# Patient Record
Sex: Female | Born: 1970 | Race: White | Hispanic: No | Marital: Single
Health system: Southern US, Community
[De-identification: ages and names within clinical notes are randomized; demographics above are authoritative.]

---

## 2004-10-19 ENCOUNTER — Emergency Department: Payer: Self-pay | Admitting: Internal Medicine

## 2004-10-22 ENCOUNTER — Emergency Department: Payer: Self-pay | Admitting: Emergency Medicine

## 2004-10-22 ENCOUNTER — Emergency Department: Payer: Self-pay | Admitting: Unknown Physician Specialty

## 2006-04-21 ENCOUNTER — Emergency Department: Payer: Self-pay | Admitting: General Practice

## 2006-05-09 ENCOUNTER — Emergency Department: Payer: Self-pay | Admitting: Emergency Medicine

## 2006-07-19 ENCOUNTER — Emergency Department: Payer: Self-pay

## 2007-05-31 ENCOUNTER — Emergency Department: Payer: Self-pay | Admitting: Emergency Medicine

## 2007-05-31 ENCOUNTER — Other Ambulatory Visit: Payer: Self-pay

## 2007-09-21 ENCOUNTER — Emergency Department: Payer: Self-pay | Admitting: Emergency Medicine

## 2007-09-21 ENCOUNTER — Other Ambulatory Visit: Payer: Self-pay

## 2007-09-29 ENCOUNTER — Emergency Department: Payer: Self-pay | Admitting: Emergency Medicine

## 2008-05-22 ENCOUNTER — Emergency Department: Payer: Self-pay | Admitting: Emergency Medicine

## 2008-05-27 ENCOUNTER — Inpatient Hospital Stay: Payer: Self-pay | Admitting: Internal Medicine

## 2008-05-27 ENCOUNTER — Other Ambulatory Visit: Payer: Self-pay

## 2008-06-17 ENCOUNTER — Other Ambulatory Visit: Payer: Self-pay

## 2008-06-17 ENCOUNTER — Emergency Department: Payer: Self-pay | Admitting: Emergency Medicine

## 2008-10-13 ENCOUNTER — Inpatient Hospital Stay: Payer: Self-pay | Admitting: Internal Medicine

## 2009-01-02 ENCOUNTER — Emergency Department: Payer: Self-pay | Admitting: Emergency Medicine

## 2009-01-13 ENCOUNTER — Emergency Department: Payer: Self-pay | Admitting: Emergency Medicine

## 2009-02-09 ENCOUNTER — Ambulatory Visit: Payer: Self-pay | Admitting: Pain Medicine

## 2009-06-26 ENCOUNTER — Emergency Department: Payer: Self-pay | Admitting: Emergency Medicine

## 2009-07-24 ENCOUNTER — Inpatient Hospital Stay: Payer: Self-pay | Admitting: Surgery

## 2009-08-29 ENCOUNTER — Emergency Department: Payer: Self-pay | Admitting: Emergency Medicine

## 2009-09-24 ENCOUNTER — Emergency Department: Payer: Self-pay | Admitting: Emergency Medicine

## 2009-11-12 ENCOUNTER — Emergency Department: Payer: Self-pay | Admitting: Emergency Medicine

## 2009-12-24 ENCOUNTER — Emergency Department: Payer: Self-pay | Admitting: Internal Medicine

## 2010-03-03 ENCOUNTER — Emergency Department: Payer: Self-pay | Admitting: Emergency Medicine

## 2010-06-25 ENCOUNTER — Emergency Department: Payer: Self-pay | Admitting: Emergency Medicine

## 2010-10-19 ENCOUNTER — Emergency Department: Payer: Self-pay | Admitting: Emergency Medicine

## 2010-11-17 ENCOUNTER — Emergency Department: Payer: Self-pay | Admitting: Internal Medicine

## 2011-07-27 ENCOUNTER — Emergency Department: Payer: Self-pay | Admitting: Emergency Medicine

## 2011-07-30 ENCOUNTER — Emergency Department: Payer: Self-pay | Admitting: Emergency Medicine

## 2011-09-17 ENCOUNTER — Emergency Department: Payer: Self-pay

## 2011-09-28 ENCOUNTER — Emergency Department: Payer: Self-pay | Admitting: Internal Medicine

## 2011-10-02 ENCOUNTER — Other Ambulatory Visit: Payer: Self-pay | Admitting: Nurse Practitioner

## 2011-10-02 ENCOUNTER — Encounter: Payer: Self-pay | Admitting: Cardiothoracic Surgery

## 2011-10-02 ENCOUNTER — Encounter: Payer: Self-pay | Admitting: Nurse Practitioner

## 2011-12-04 ENCOUNTER — Encounter: Payer: Self-pay | Admitting: Cardiothoracic Surgery

## 2011-12-27 ENCOUNTER — Emergency Department: Payer: Self-pay | Admitting: Emergency Medicine

## 2012-01-19 ENCOUNTER — Emergency Department: Payer: Self-pay | Admitting: Internal Medicine

## 2012-01-29 ENCOUNTER — Emergency Department: Payer: Self-pay | Admitting: *Deleted

## 2012-03-15 ENCOUNTER — Emergency Department: Payer: Self-pay | Admitting: *Deleted

## 2012-09-19 ENCOUNTER — Emergency Department: Payer: Self-pay | Admitting: Internal Medicine

## 2012-09-19 LAB — URINALYSIS, COMPLETE
Bilirubin,UR: NEGATIVE
Glucose,UR: 50 mg/dL (ref 0–75)
Leukocyte Esterase: NEGATIVE
Nitrite: NEGATIVE
Protein: >=500
RBC,UR: 19 /HPF (ref 0–5)
Specific Gravity: 1.023 (ref 1.003–1.030)
Squamous Epithelial: 18
WBC UR: 20 /HPF (ref 0–5)

## 2012-09-19 LAB — COMPREHENSIVE METABOLIC PANEL
Albumin: 4.5 g/dL (ref 3.4–5.0)
BUN: 13 mg/dL (ref 7–18)
Calcium, Total: 10.2 mg/dL — ABNORMAL HIGH (ref 8.5–10.1)
EGFR (African American): >60
Glucose: 138 mg/dL — ABNORMAL HIGH (ref 65–99)
Osmolality: 269 (ref 275–301)
SGOT(AST): 21 U/L (ref 15–37)
SGPT (ALT): 21 U/L (ref 12–78)
Total Protein: 10.4 g/dL — ABNORMAL HIGH (ref 6.4–8.2)

## 2012-09-19 LAB — CBC
HCT: 48.1 % — ABNORMAL HIGH (ref 35.0–47.0)
HGB: 16.4 g/dL — ABNORMAL HIGH (ref 12.0–16.0)
MCHC: 34 g/dL (ref 32.0–36.0)
Platelet: 292 10*3/uL (ref 150–440)
RBC: 5.14 10*6/uL (ref 3.80–5.20)
WBC: 16.2 10*3/uL — ABNORMAL HIGH (ref 3.6–11.0)

## 2012-09-19 LAB — DRUG SCREEN, URINE
Barbiturates, Ur Screen: NEGATIVE (ref ?–200)
Benzodiazepine, Ur Scrn: NEGATIVE (ref ?–200)
Cannabinoid 50 Ng, Ur ~~LOC~~: POSITIVE (ref ?–50)
MDMA (Ecstasy)Ur Screen: NEGATIVE (ref ?–500)
Methadone, Ur Screen: NEGATIVE (ref ?–300)
Opiate, Ur Screen: NEGATIVE (ref ?–300)
Phencyclidine (PCP) Ur S: NEGATIVE (ref ?–25)

## 2012-09-25 LAB — CULTURE, BLOOD (SINGLE)

## 2012-11-30 IMAGING — CT CT CERVICAL SPINE WITHOUT CONTRAST
1 series · 12 of 14 positions shown, 15 images · non-contrast
Comparison: none

REASON FOR EXAM: fall from [place] with pain C5-6 to T1 area,  hit head
also   Flex 10
COMMENTS:   LMP: Now

PROCEDURE:     CT  - CT CERVICAL SPINE WO  - January 29, 2012  [DATE]
RESULT:     CT cervical spine
TECHNIQUE: Helical 2 mm sections were obtained. Reconstructions were
performed utilizing a bone algorithm in coronal, sagittal, and axial planes.

[Series 6: axial · axial · 0.31mm/px · z∈[-254,-111]mm · 12 of 91 slices shown, 15 images]
[im 7/91  soft-tissue]
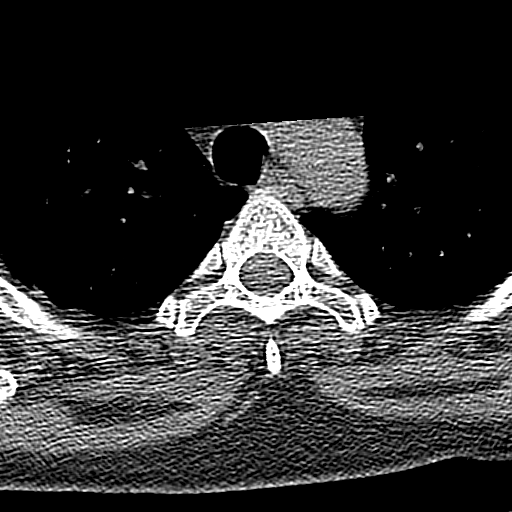
[im 7/91  bone]
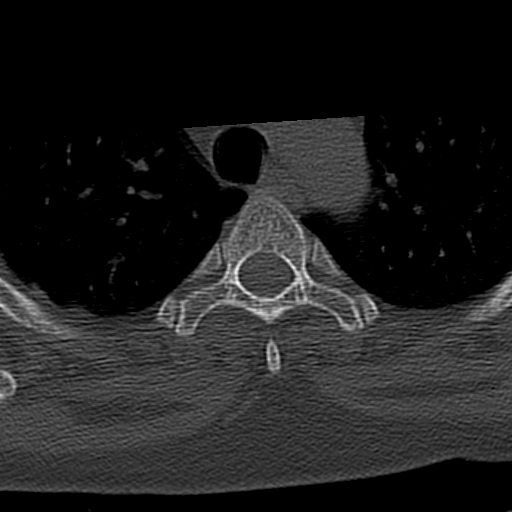
[im 14/91  bone]
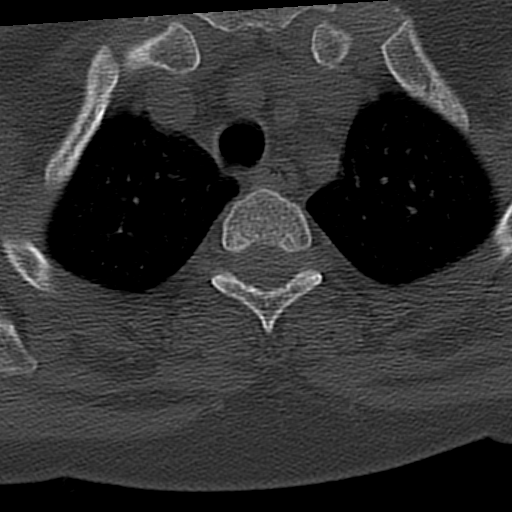
[im 21/91  bone]
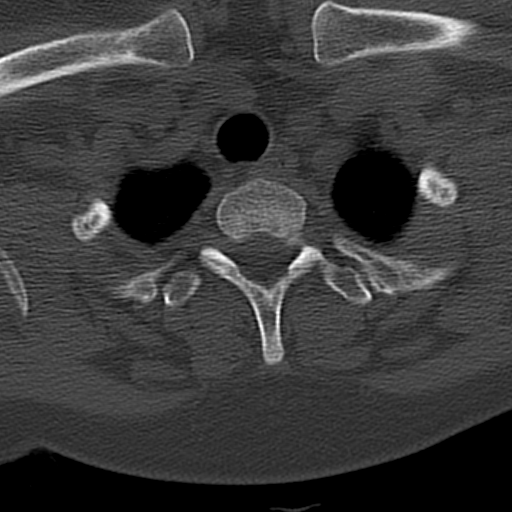
[im 28/91  bone]
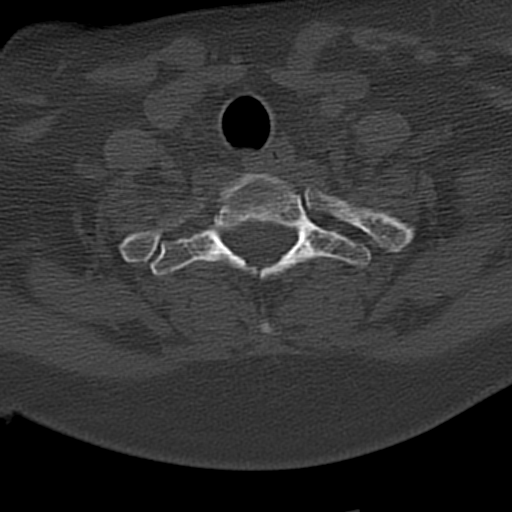
[im 35/91  soft-tissue]
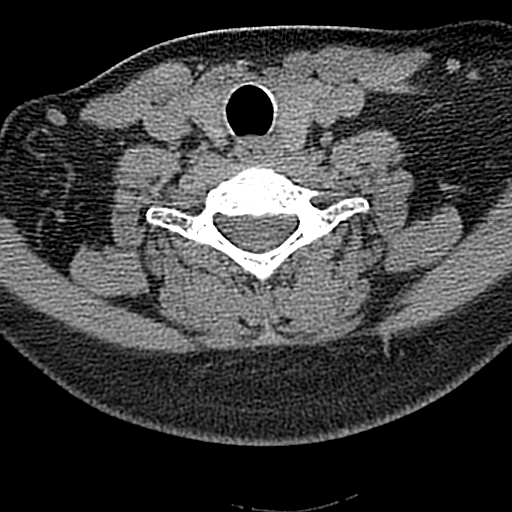
[im 35/91  bone]
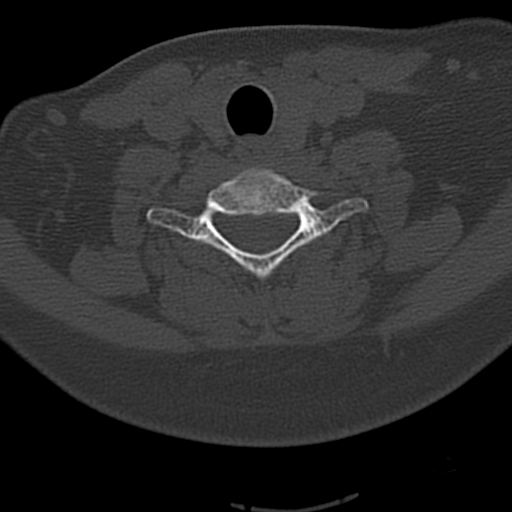
[im 42/91  bone]
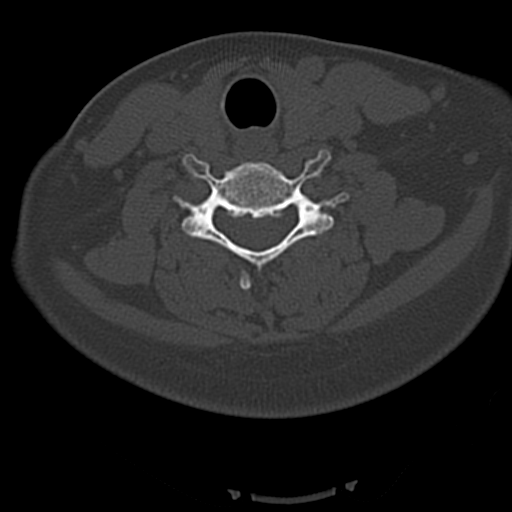
[im 49/91  bone]
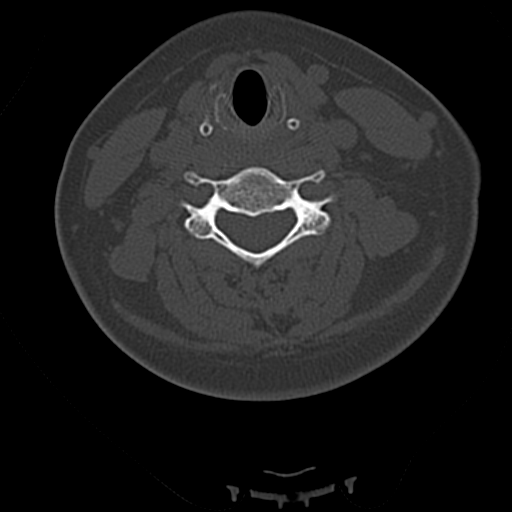
[im 56/91  bone]
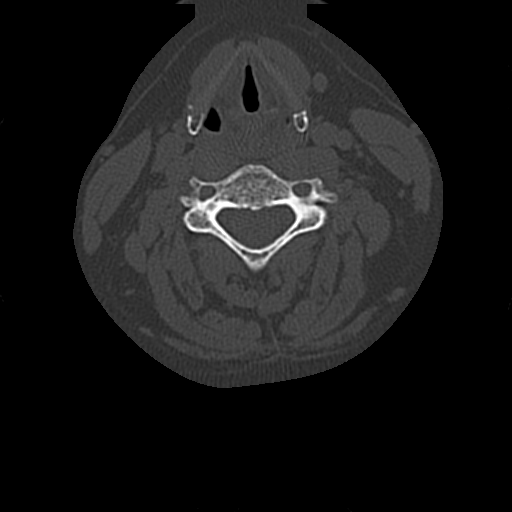
[im 63/91  soft-tissue]
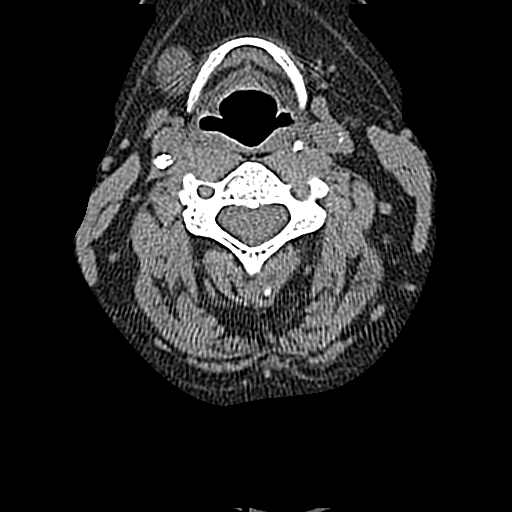
[im 63/91  bone]
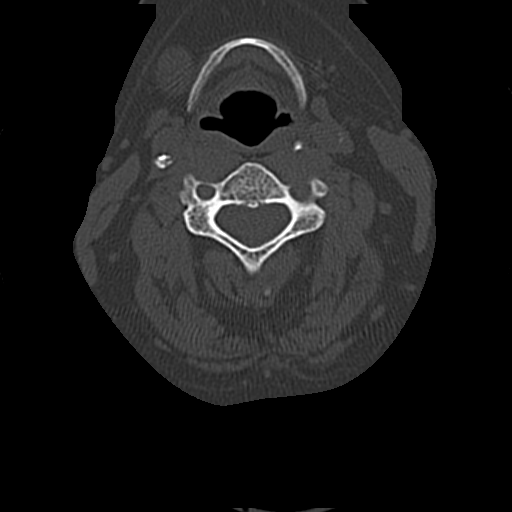
[im 70/91  bone]
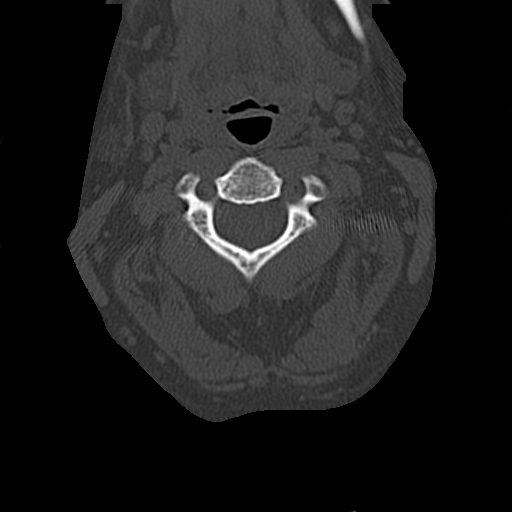
[im 77/91  bone]
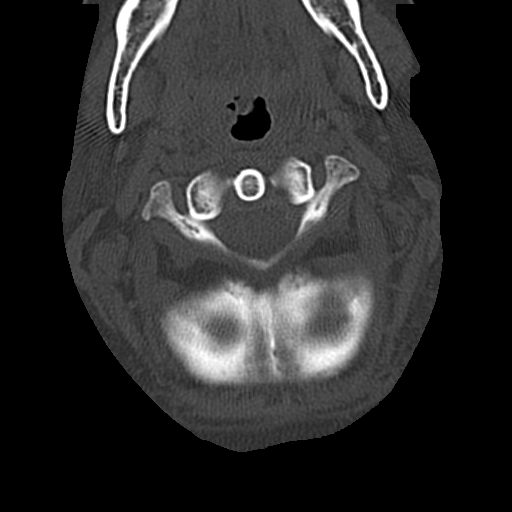
[im 84/91  bone]
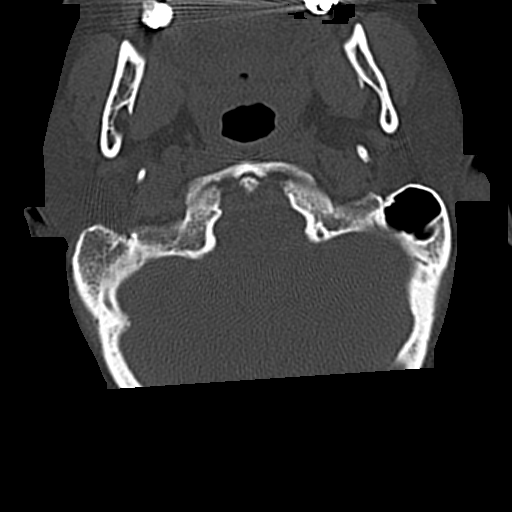

[12 of 14 positions shown; findings below may reference images not displayed]

FINDINGS: There is no evidence of acute fracture, dislocation, or
malalignment. There is no evidence of prevertebral soft tissue swelling nor
evidence of canal stenosis.
IMPRESSION: No CT evidence of acute osseous abnormalities.

## 2012-11-30 IMAGING — CT CT HEAD WITHOUT CONTRAST
1 series · 16 of 30 positions shown, 20 images · non-contrast
Comparison: none

REASON FOR EXAM: fall from [place]   [place] 10  h/a, dizziness and right
sided head pain
COMMENTS:   LMP: Now

PROCEDURE:     CT  - CT HEAD WITHOUT CONTRAST  - January 29, 2012  [DATE]
RESULT:     Technique: Helical 5mm sections were obtained from the skull
base to the vertex without administration of intravenous contrast.

[Series 2: soft tissue · axial · 0.45mm/px · z∈[-147,-12]mm · 16 of 31 slices shown, 20 images]
[im 2/31  brain]
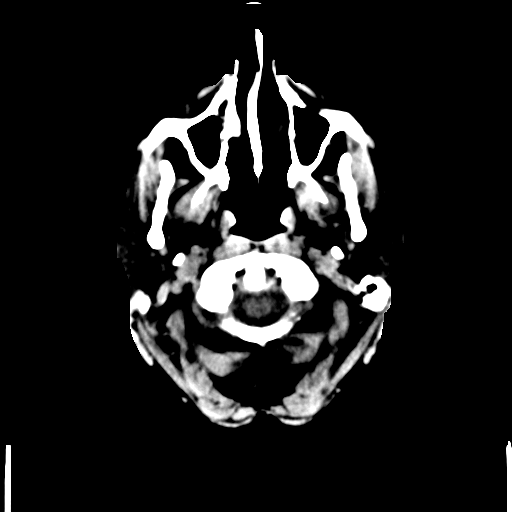
[im 2/31  bone]
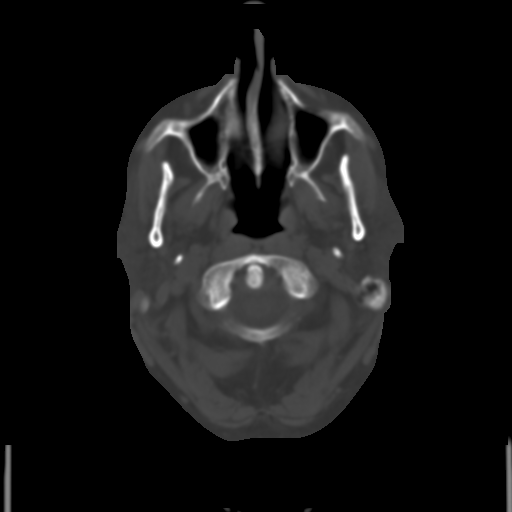
[im 4/31  brain]
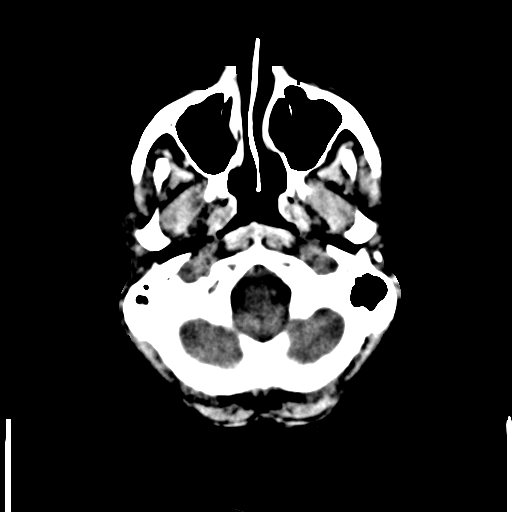
[im 6/31  brain]
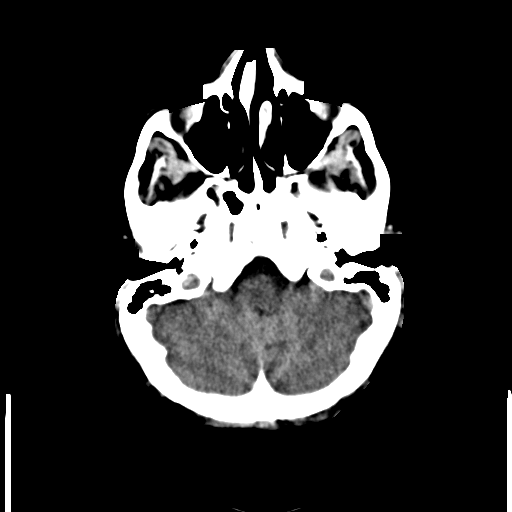
[im 8/31  brain]
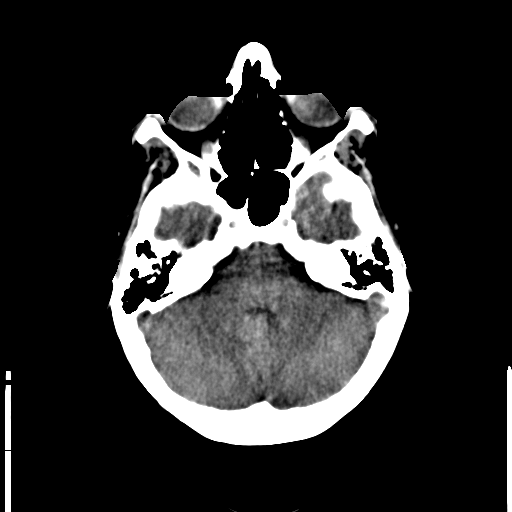
[im 9/31  brain]
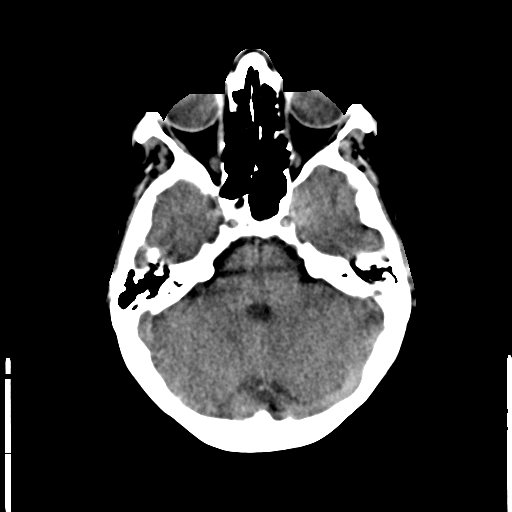
[im 9/31  bone]
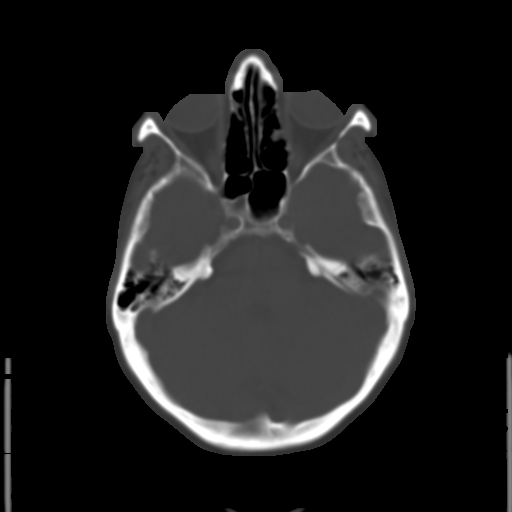
[im 11/31  brain]
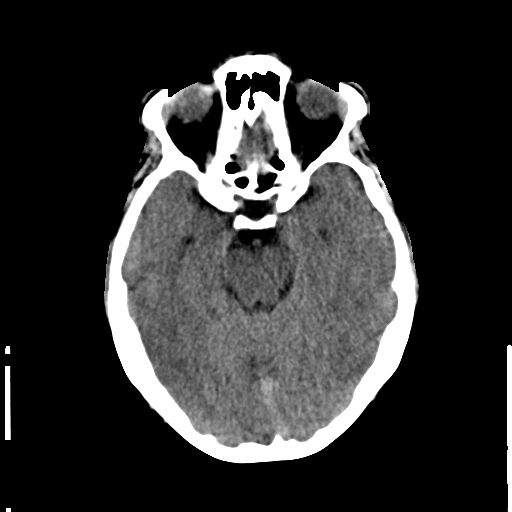
[im 13/31  brain]
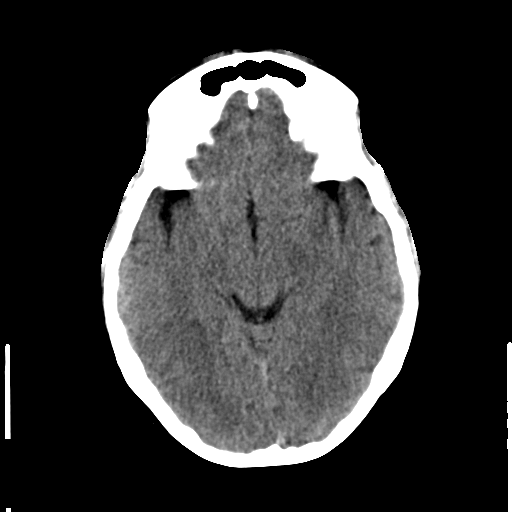
[im 15/31  brain]
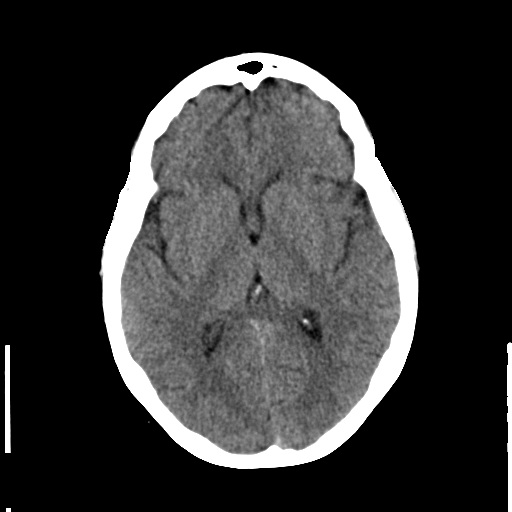
[im 16/31  brain]
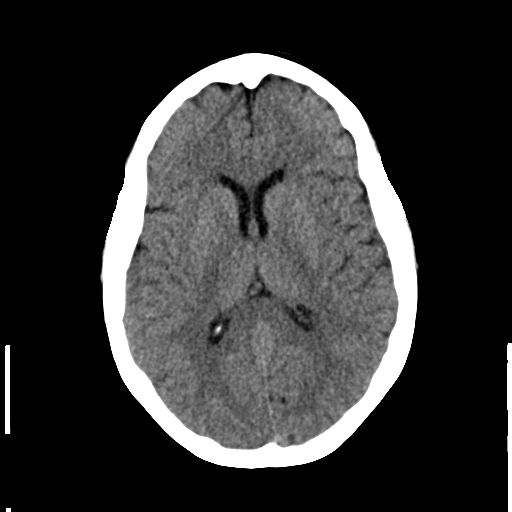
[im 16/31  bone]
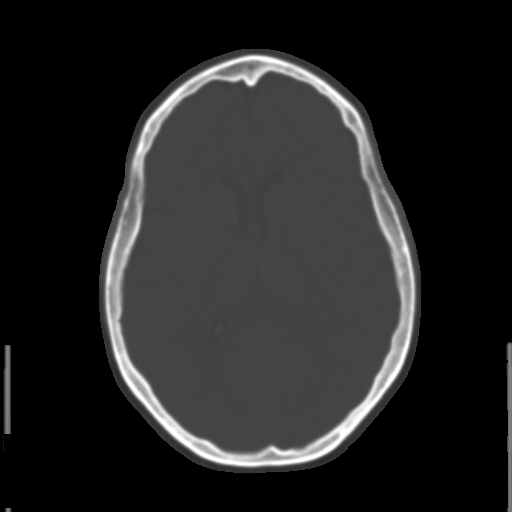
[im 18/31  brain]
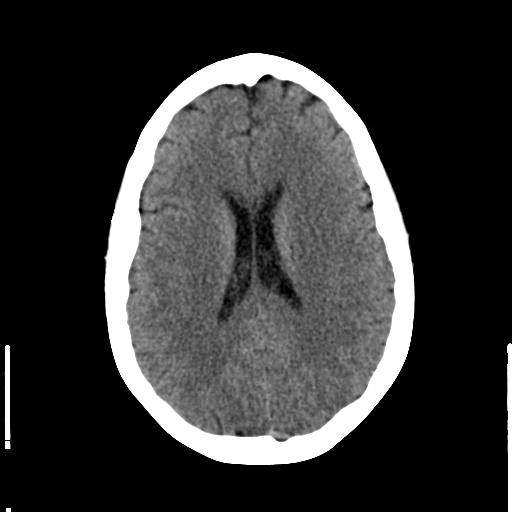
[im 20/31  brain]
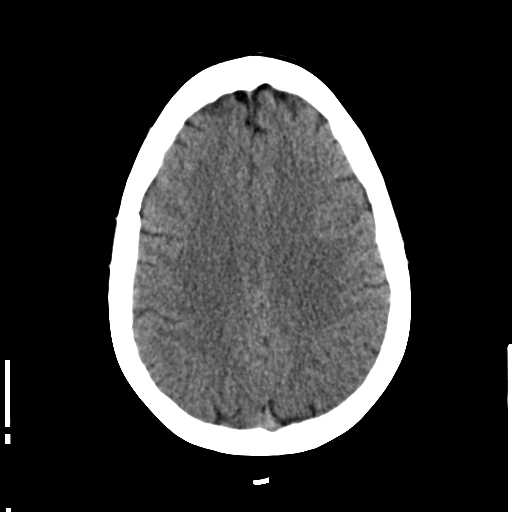
[im 22/31  brain]
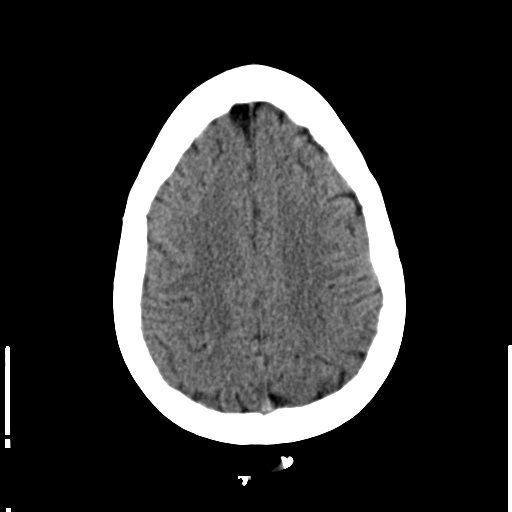
[im 23/31  brain]
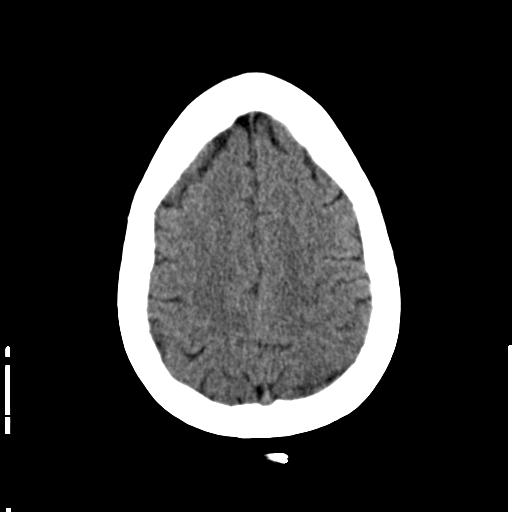
[im 23/31  bone]
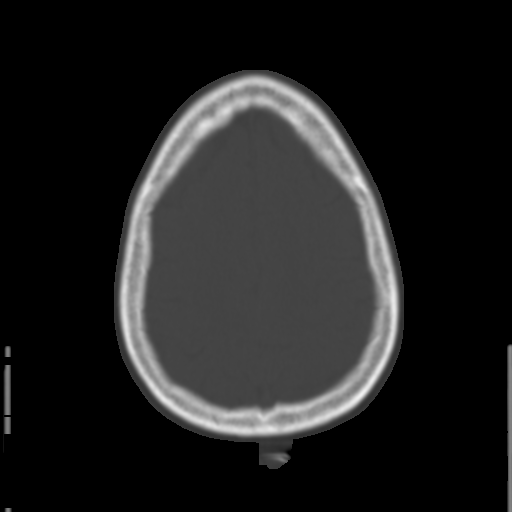
[im 25/31  brain]
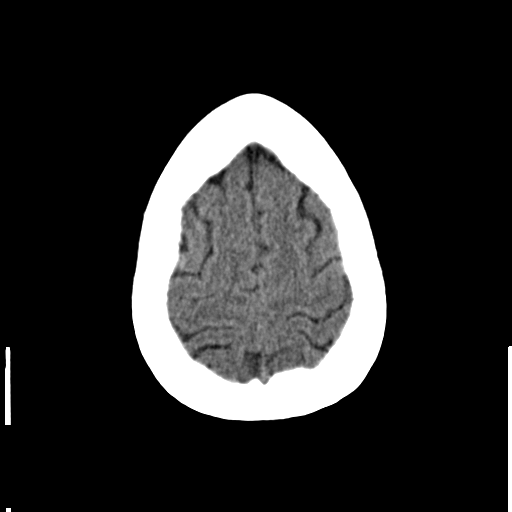
[im 27/31  brain]
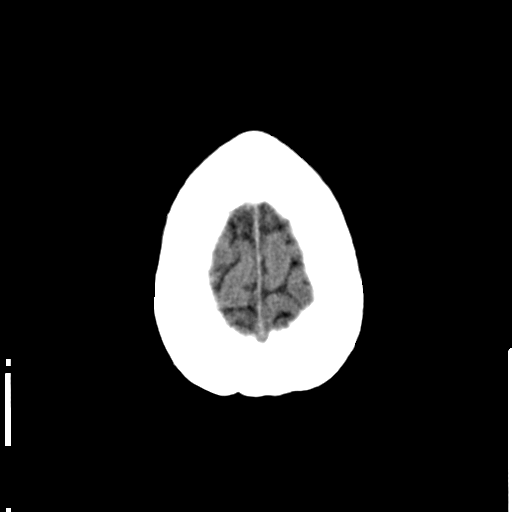
[im 29/31  brain]
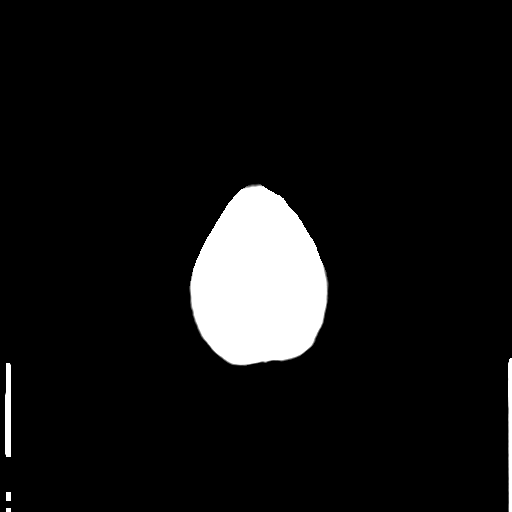

[16 of 30 positions shown; findings below may reference images not displayed]

FINDINGS: There is not evidence of intra-axial fluid collections. There is
no evidence of acute hemorrhage or secondary signs reflecting mass effect or
subacute or chronic focal territorial infarction. The osseous structures
demonstrate no evidence of a depressed skull fracture. If there is
persistent concern clinical follow-up with MRI is recommended.
IMPRESSION: 1. No evidence of acute intracranial abnormalitites.
2. A comparison is made to prior study dated 11/17/2010.

## 2013-04-01 LAB — COMPREHENSIVE METABOLIC PANEL
Anion Gap: 10 (ref 7–16)
BUN: 32 mg/dL — ABNORMAL HIGH (ref 7–18)
Co2: 25 mmol/L (ref 21–32)
Creatinine: 1 mg/dL (ref 0.60–1.30)
EGFR (Non-African Amer.): >60
Glucose: 119 mg/dL — ABNORMAL HIGH (ref 65–99)
Osmolality: 273 (ref 275–301)
Potassium: 3.2 mmol/L — ABNORMAL LOW (ref 3.5–5.1)
SGPT (ALT): 27 U/L (ref 12–78)

## 2013-04-01 LAB — URINALYSIS, COMPLETE
Bilirubin,UR: NEGATIVE
Glucose,UR: NEGATIVE mg/dL (ref 0–75)
Hyaline Cast: 1
Leukocyte Esterase: NEGATIVE
Ph: 5 (ref 4.5–8.0)
RBC,UR: 13 /HPF (ref 0–5)
Specific Gravity: 1.033 (ref 1.003–1.030)
Squamous Epithelial: 1
WBC UR: 11 /HPF (ref 0–5)

## 2013-04-01 LAB — CBC
HCT: 48.9 % — ABNORMAL HIGH (ref 35.0–47.0)
HGB: 16.6 g/dL — ABNORMAL HIGH (ref 12.0–16.0)
MCH: 31.1 pg (ref 26.0–34.0)
MCHC: 34 g/dL (ref 32.0–36.0)
Platelet: 296 10*3/uL (ref 150–440)
WBC: 17.2 10*3/uL — ABNORMAL HIGH (ref 3.6–11.0)

## 2013-04-01 LAB — PREGNANCY, URINE: Pregnancy Test, Urine: NEGATIVE m[IU]/mL

## 2013-04-02 ENCOUNTER — Inpatient Hospital Stay: Payer: Self-pay | Admitting: Internal Medicine

## 2013-04-02 LAB — CLOSTRIDIUM DIFFICILE BY PCR

## 2013-04-03 LAB — BASIC METABOLIC PANEL
BUN: 12 mg/dL (ref 7–18)
Chloride: 103 mmol/L (ref 98–107)
Co2: 28 mmol/L (ref 21–32)
EGFR (African American): >60
Glucose: 85 mg/dL (ref 65–99)
Potassium: 2.9 mmol/L — ABNORMAL LOW (ref 3.5–5.1)
Sodium: 138 mmol/L (ref 136–145)

## 2013-04-03 LAB — CBC WITH DIFFERENTIAL/PLATELET
Basophil %: 0.6 %
Eosinophil #: 0.3 10*3/uL (ref 0.0–0.7)
HCT: 36.6 % (ref 35.0–47.0)
HGB: 12.5 g/dL (ref 12.0–16.0)
Lymphocyte %: 20.8 %
MCV: 92 fL (ref 80–100)
Monocyte %: 11.9 %
Neutrophil %: 64.2 %
RBC: 4 10*6/uL (ref 3.80–5.20)
WBC: 11.8 10*3/uL — ABNORMAL HIGH (ref 3.6–11.0)

## 2013-04-03 LAB — URINE CULTURE

## 2013-04-05 LAB — CBC WITH DIFFERENTIAL/PLATELET
Basophil %: 0.5 %
Eosinophil #: 0.5 10*3/uL (ref 0.0–0.7)
HGB: 12.3 g/dL (ref 12.0–16.0)
Lymphocyte #: 2.1 10*3/uL (ref 1.0–3.6)
Lymphocyte %: 15.7 %
MCV: 91 fL (ref 80–100)
Monocyte %: 10.9 %
Neutrophil #: 9.4 10*3/uL — ABNORMAL HIGH (ref 1.4–6.5)
Platelet: 207 10*3/uL (ref 150–440)
RBC: 3.95 10*6/uL (ref 3.80–5.20)
RDW: 12.1 % (ref 11.5–14.5)
WBC: 13.6 10*3/uL — ABNORMAL HIGH (ref 3.6–11.0)

## 2013-04-05 LAB — BASIC METABOLIC PANEL
BUN: 6 mg/dL — ABNORMAL LOW (ref 7–18)
Calcium, Total: 8.3 mg/dL — ABNORMAL LOW (ref 8.5–10.1)
Creatinine: 0.64 mg/dL (ref 0.60–1.30)
EGFR (African American): >60
Osmolality: 277 (ref 275–301)
Sodium: 139 mmol/L (ref 136–145)

## 2013-04-05 LAB — LIPID PANEL
Cholesterol: 104 mg/dL (ref 0–200)
HDL Cholesterol: 25 mg/dL — ABNORMAL LOW (ref 40–60)
Ldl Cholesterol, Calc: 55 mg/dL (ref 0–100)

## 2013-04-05 LAB — MAGNESIUM: Magnesium: 1.2 mg/dL — ABNORMAL LOW

## 2013-04-06 LAB — PATHOLOGY REPORT

## 2013-04-07 LAB — CULTURE, BLOOD (SINGLE)

## 2013-07-05 ENCOUNTER — Emergency Department: Payer: Self-pay | Admitting: Internal Medicine

## 2014-06-17 ENCOUNTER — Emergency Department: Payer: Self-pay | Admitting: Emergency Medicine

## 2014-06-17 LAB — COMPREHENSIVE METABOLIC PANEL
ANION GAP: 11 (ref 7–16)
AST: 23 U/L (ref 15–37)
Albumin: 4.4 g/dL (ref 3.4–5.0)
Alkaline Phosphatase: 85 U/L
BUN: 18 mg/dL (ref 7–18)
Bilirubin,Total: 0.5 mg/dL (ref 0.2–1.0)
CHLORIDE: 96 mmol/L — AB (ref 98–107)
CREATININE: 1.07 mg/dL (ref 0.60–1.30)
Calcium, Total: 9.7 mg/dL (ref 8.5–10.1)
Co2: 28 mmol/L (ref 21–32)
EGFR (Non-African Amer.): >60
GLUCOSE: 151 mg/dL — AB (ref 65–99)
OSMOLALITY: 275 (ref 275–301)
Potassium: 3.6 mmol/L (ref 3.5–5.1)
SGPT (ALT): 26 U/L
Sodium: 135 mmol/L — ABNORMAL LOW (ref 136–145)
Total Protein: 9.3 g/dL — ABNORMAL HIGH (ref 6.4–8.2)

## 2014-06-17 LAB — CBC
HCT: 48.1 % — AB (ref 35.0–47.0)
HGB: 15.9 g/dL (ref 12.0–16.0)
MCH: 29.8 pg (ref 26.0–34.0)
MCHC: 33 g/dL (ref 32.0–36.0)
MCV: 90 fL (ref 80–100)
PLATELETS: 304 10*3/uL (ref 150–440)
RBC: 5.34 10*6/uL — AB (ref 3.80–5.20)
RDW: 12.5 % (ref 11.5–14.5)
WBC: 12.7 10*3/uL — ABNORMAL HIGH (ref 3.6–11.0)

## 2014-06-17 LAB — URINALYSIS, COMPLETE
BILIRUBIN, UR: NEGATIVE
Glucose,UR: NEGATIVE mg/dL (ref 0–75)
Ketone: NEGATIVE
Leukocyte Esterase: NEGATIVE
Nitrite: NEGATIVE
PH: 7 (ref 4.5–8.0)
Protein: 100
RBC,UR: 9 /HPF (ref 0–5)
Specific Gravity: 1.019 (ref 1.003–1.030)
Squamous Epithelial: 8
WBC UR: 3 /HPF (ref 0–5)

## 2014-06-17 LAB — LIPASE, BLOOD: Lipase: 92 U/L (ref 73–393)

## 2014-06-17 LAB — PREGNANCY, URINE: PREGNANCY TEST, URINE: NEGATIVE m[IU]/mL

## 2014-10-20 ENCOUNTER — Emergency Department: Payer: Self-pay | Admitting: Emergency Medicine

## 2014-10-20 LAB — URINALYSIS, COMPLETE
Bilirubin,UR: NEGATIVE
Glucose,UR: NEGATIVE mg/dL (ref 0–75)
Ketone: NEGATIVE
Nitrite: POSITIVE
PH: 7 (ref 4.5–8.0)
Protein: 100
Specific Gravity: 1.018 (ref 1.003–1.030)
WBC UR: 530 /HPF (ref 0–5)

## 2014-10-22 LAB — URINE CULTURE

## 2015-01-19 ENCOUNTER — Emergency Department: Payer: Self-pay | Admitting: Emergency Medicine

## 2015-01-19 LAB — URINALYSIS, COMPLETE
Bacteria: NONE SEEN
Bilirubin,UR: NEGATIVE
Blood: NEGATIVE
Glucose,UR: NEGATIVE mg/dL
Ketone: NEGATIVE
Leukocyte Esterase: NEGATIVE
Nitrite: NEGATIVE
Ph: 6
Protein: 30
RBC,UR: 18 /HPF
Specific Gravity: 1.02
Squamous Epithelial: 15
WBC UR: 10 /HPF

## 2015-01-21 LAB — URINE CULTURE

## 2015-02-18 NOTE — Consult Note (Signed)
PATIENT NAME:  Melanie Novak, Melanie Novak MR#:  [id number] DATE OF BIRTH:  10-11-1971  DATE OF CONSULTATION:  04/02/2013  REFERRING PHYSICIAN:   CONSULTING PHYSICIAN:  Midge Miniumarren Robertha Staples, MD  CONSULTING SERVICE:  Gastroenterology.  CHIEF COMPLAINT:  Rectal bleeding, nausea, vomiting, abdominal pain.   HISTORY OF PRESENT ILLNESS:  This patient is a 44 year old woman who has a history of recurrent pancreatitis and a pseudocyst with her being seen in the past by Dr. Marva PandaSkulskie and [facility].  The patient now reports that she has had approximately five days of left upper quadrant pain which is sharp.  She also states that she has her pain typical of her acute pancreatitis.  The patient also reports that she had some bright red blood per rectum.  The patient had a CT scan of her abdomen that did not show any acute abnormalities and did not show any masses or cysts in her pancreas as had been seen prior.  She also reports that she has been having diarrhea in addition to the bright red blood per rectum.  The patient's last admission to this hospital for her pancreatic pseudocyst and abdominal pain with pancreatitis was back in September of 2010.  She denies vomiting any blood.  The patient has been recently in jail for failure to appear in court on a speeding ticket.   PAST MEDICAL HISTORY:  Recurrent pancreatitis.  She has a history of alcohol abuse, but has not drank for years.  She has a history of pancreatic cyst as described above.  Last episode of pancreatitis one year ago.  Hypertension, GERD, chronic back pain, anxiety.  PAST SURGICAL HISTORY:  Upper endoscopy and drainage of pancreatic pseudocyst.   SOCIAL HABITS:  Positive for tobacco use.  Quit drinking.  No IV drugs.    SOCIAL HISTORY:  Noncontributory.   FAMILY HISTORY:  Noncontributory.   CURRENT MEDICATIONS:  None.   ALLERGIES:  LEVAQUIN AND MACROBID.   PHYSICAL EXAMINATION:   GENERAL:  The patient lying in bed, in  no apparent distress, answering questions appropriately.  VITAL SIGNS:  Temperature 98.3, pulse 87, respirations 20, blood pressure 119/77, pulse oximetry 96%.  HEENT:  Normocephalic, atraumatic.  Extraocular motor intact.  Pupils equally round and reactive to light and accommodation, without JVD, without lymphadenopathy.  LUNGS:  Clear to auscultation bilaterally.  HEART:  Regular rate and rhythm without murmurs, rubs or gallops.  ABDOMEN:  Soft with tenderness in the left upper quadrant that is exacerbated by raising the patient's leg 6 inches up above the bed and palpating the abdomen with one finger.  No hepatosplenomegaly, no rebound, no guarding. EXTREMITIES:  Without cyanosis, clubbing or edema.   NEUROLOGICAL:  Grossly intact.  SKIN: Without any lesions or rashes.  ANCILLARY SERVICES:  CT scan as mentioned above.    LABORATORY DATA:  Sodium low at 132.  LFTs normal with lipase increased at 647.  White cells of 17.2, hemoglobin 16.6, hematocrit 48.9.  C. diff. negative.  Blood cultures negative.  Urinalysis with white cells and bacteria and blood.   ASSESSMENT AND PLAN:  This patient is a 44 year old woman who has a history of recurrent pancreatitis and comes in with increased white cell count, hemoconcentrated hemoglobin and hematocrit with hyponatremia.  The patient has had diarrhea and rectal bleeding with nausea and vomiting.  The patient's abdominal pain is consistent with musculoskeletal pain from her continuous vomiting.  It is unlikely due to her acute gastrointestinal illness due to the  pinpoint pain only being in the left upper quadrant with her likely gastroenteritis affecting her entire intestines, not just the left upper quadrant.  The patient does have nausea and vomiting and peptic ulcer disease should be considered and the patient will be set up for an upper endoscopy.  The patient has also had rectal bleeding with diarrhea and a colonoscopy will be done at the same time  tomorrow.  The patient will be prepped today.   Thank you very much for involving me in the care of this patient.  If you have any questions, do not hesitate to call.    ____________________________ Midge Minium, MD dw:ea Novak: 04/02/2013 19:14:28 ET T: 04/02/2013 20:40:26 ET JOB#: [id number]  cc: Midge Minium, MD, <Dictator> Midge Minium MD ELECTRONICALLY SIGNED 04/11/2013 9:19

## 2015-02-18 NOTE — Discharge Summary (Signed)
PATIENT NAME:  Melanie Novak, Melanie Novak MR#:  [id number] DATE OF BIRTH:  [birth year]  DATE OF ADMISSION:  04/02/2013  DATE OF DISCHARGE:  04/03/2013  ADMISSION DIAGNOSIS:  Acute pancreatitis.   DISCHARGE DIAGNOSES:   1.  Acute pancreatitis. 2.  Rectal bleeding.  3.  Hypokalemia. 4.  Hypomagnesemia.  CONSULT:  Dr. Daleen SquibbWall.   PROCEDURES: The patient underwent an EGD on 04/03/2013, which essentially was normal, showing a normal esophagus and normal stomach, with some erythematous adenopathy.  Colonoscopy was also performed, which basically showed some nonbleeding internal hemorrhoids.   LABORATORIES: Sodium 138, potassium 2.9, chloride 103, bicarb 28, BUN 12, creatinine normal glucose 85. White blood cells 11, hemoglobin 12.5, hematocrit 37, platelets are 209.   Urine culture negative. Blood cultures are negative to date. C. diff and stool cultures were negative.   HOSPITAL COURSE:  A 44 year old female who presented with abdominal pain, found to have pancreatitis, was also complaining of rectal bleeding, who underwent an EGD/colonoscopy, which essentially was normal. For further details, please refer to the H and P.   1.  Acute pancreatitis, on chronic. Patient reports similar symptoms in the past, with epigastric pain radiating to her shoulders. She was placed on supportive care with IV fluids, n.p.o. status, pain medications. Her pain is better, so she was tried on ice chips, liquid diet, and her diet was advanced as tolerated. If she tolerates her regular diet she wil be disharged home.    2.  Rectal bleeding. We appreciate Dr. Vern ClaudeWall's consult. The patient is status post EGD and colonoscopy, essentially without any acute findings. She only had one episode prior to admission.   3.  Hypokalemia and hypomagnesemia, which were repleted prior to discharge.   4.  Hypertension. Patient will continue on Norvasc, and will need outpatient followup.   DISCHARGE MEDICATIONS:  Norvasc 10 mg daily, Prilosec  20 mg b.i.Novak.   DISCHARGE DIET:  Low sodium with bananas and oranges for low potassium.  DISCHARGE ACTIVITY:  As tolerated.   DISCHARGE FOLLOW UP:  Patient will need to find a primary care physician in the area, which she says she will look fr   TIME SPENT:  Approximately 35 minutes.     ____________________________ Janyth ContesSital P. Juliene PinaMody, MD spm:mr Novak: 04/03/2013 14:14:10 ET T: 04/03/2013 22:19:51 ET JOB#: [id number]  cc: Levina Boyack P. Juliene PinaMody, MD, <Dictator> Shequilla Goodgame P Fredis Malkiewicz MD ELECTRONICALLY SIGNED 04/06/2013 15:00

## 2015-02-18 NOTE — H&P (Signed)
PATIENT NAME:  Melanie SagesWOODS, Melanie Novak DATE OF BIRTH:  16-Jan-1971  DATE OF ADMISSION:  04/02/2013  PRIMARY CARE PHYSICIAN: She has no physician.   REFERRING PHYSICIANS: Dr. Jene Everyobert Kinner and the physician assistant.   CHIEF COMPLAINT: Left upper quadrant abdominal pain since Saturday, that is 5 days ago, associated with nausea, vomiting and diarrhea.   HISTORY OF PRESENT ILLNESS: Melanie Novak is a 44 year old Caucasian female with past medical history of recurrent pancreatitis complicated at one time by pseudocyst formation. The patient was in her usual state of health until last Saturday, that is 5 days ago, when she developed left upper quadrant abdominal pain described as sharp in nature, 10 on a scale of 10, constant in nature, radiating to the left shoulder, typical of her acute pancreatitis pain. Reported nausea, vomiting and watery diarrhea. She indicates that the last episode of diarrhea happened in the Emergency Department here today, associated with bright red blood per rectum. There is no prior rectal bleeding. Denies any fever. No chills. Evaluation here at the Emergency Department revealed elevated lipase. Her CAT scan of the abdomen failed to show any abscess formation or pseudocyst or any inflammation. The patient was admitted for further evaluation and treatment. Of note, the patient just left the jail after 5 days imprisonment due to failure to appear at court on an issue related to a speeding ticket.   REVIEW OF SYSTEMS:   CONSTITUTIONAL: Denies having any fever. No chills. No fatigue.  EYES: No blurring of vision. No double vision.  ENT: No hearing impairment. No sore throat. No dysphagia.  CARDIOVASCULAR: No chest pain. No shortness of breath. No edema. No syncope.  RESPIRATORY: No cough. No shortness of breath. No chest pain.  GASTROINTESTINAL: Reports abdominal pain as described above at the left upper quadrant area associated with nausea, vomiting, diarrhea and 1  episode of rectal bleeding.  GENITOURINARY: No dysuria. No frequency of urination.  MUSCULOSKELETAL: No joint pain or swelling. No muscular pain or swelling.  INTEGUMENTARY: No skin rash. No ulcers.  NEUROLOGY: No focal weakness. No seizure activity. No headache.  PSYCHIATRY: No anxiety. No depression.  ENDOCRINE: No polyuria or polydipsia. No heat or cold intolerance.   PAST MEDICAL HISTORY: History of recurrent pancreatitis, used to be alcoholic in origin but she did not drink alcohol for the last 4 years. [facility]. Last episode of pancreatitis was a year ago, [facility]. Also, history of hypertension, gastroesophageal reflux disease, hiatal hernia, chronic back pain, L4-L5 disease, anxiety disorder   PAST SURGICAL HISTORY: Upper endoscopy and drainage of pancreatic pseudocyst.   SOCIAL HABITS: Chronic smoker, 1 pack per day since the age of 44. She tells me that she quit smoking 2 weeks ago. No alcohol currently but used to drink in the past. She quit 4 years ago. No other drug abuse.   SOCIAL HISTORY: She is single. 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   FAMILY HISTORY: Her father has diabetes mellitus, diverticulosis and he had coronary artery bypass graft. He is 42 years old, still living. Her mother is also 4168 yeas old, suffers from diabetes mellitus, coronary artery disease status post stent. She also suffered from ovarian cancer.   ADMISSION MEDICATIONS: None currently.   ALLERGIES: LEVAQUIN AND MACROBID CAUSING SKIN RASH.   PHYSICAL EXAMINATION:  VITAL SIGNS: Blood pressure 155/105, respiratory rate 16, pulse 102, temperature 99.1, O2 saturation 98%.  GENERAL APPEARANCE: Maple HudsonYoung  female lying in bed in no acute distress at the time of my examination.  HEAD AND NECK: No pallor. No icterus. No cyanosis. Ear examination revealed normal hearing, no discharge, no ulcers. Examination of the nose showed no  discharge, no bleeding, no ulcers. Oropharyngeal examination revealed normal lips and tongue. No oral thrush, no ulcers. Eye examination revealed normal eyelids and conjunctivae. Pupils about 5 to 6 mm, round, equal and reactive to light. Neck is supple. Trachea at midline. No thyromegaly. No cervical lymphadenopathy. No masses.  HEART: Normal S1, S2. No S3, S4. No murmur. No gallop. No carotid bruits.  RESPIRATORY: Normal breathing pattern without use of accessory muscles. No rales. No wheezing.  ABDOMEN: Soft. There is tenderness at the epigastric area and left upper quadrant area which is moderate upon moderate palpation. No rebound. No rigidity. No organomegaly. No hernias.  SKIN: No ulcers. No subcutaneous nodules.  MUSCULOSKELETAL: No joint swelling. No clubbing.  NEUROLOGIC: Cranial nerves II through XII were intact. No focal motor deficit.  PSYCHIATRIC: The patient is alert and oriented x3. Mood and affect were normal.   LABORATORY FINDINGS: CAT scan of the abdomen showed no acute abdominal or pelvic abnormality. No adenopathy. No pancreatic mass or dilatation or pancreatitis. Blood workup showed serum glucose 119, BUN 52, creatinine 1, sodium 132, potassium 3.2, lipase 647, total protein 9.5, albumin 4.5. Normal liver transaminases. CBC showed white count of 17,000, hemoglobin 16, hematocrit 48, platelet count 296. Urine showed cloudy urine, 11 white blood cells, +2 bacteria. Urine pregnancy test was negative.   ASSESSMENT:  1. Left upper quadrant abdominal pain consistent with her acute pancreatitis.  2. One episode of rectal bleeding.  3. Leukocytosis. White blood cells reaching 17,000, likely secondary to her pancreatitis.  4. Mild hyponatremia and hypokalemia.  5. Gastroenteritis 6. Pyuria and bacteriuria, likely secondary to urinary tract infection. Needs to be further looked at.  7. Systemic hypertension, uncontrolled.   PLAN: Will admit the patient to the medical floor. IV  hydration with normal saline. Potassium replacement. Pain control with morphine. Zofran for nausea and vomiting. Follow up on CBC, basic metabolic profile and lipase. GI consultation. IV Protonix. Keep n.p.o. until pain is controlled. Urine for culture and sensitivity. Stool for culture. Stool for C. difficile. Blood pressure control. I will place her on amlodipine and monitor her blood pressure. The patient received intravenous Zosyn at the Emergency Department. I will change that to ciprofloxacin to cover for gastroenteritis and UTI as well. Will watch for any skin rash. If that happens, will discontinue that. THE PATIENT HAS ALLERGY TO LEVAQUIN CAUSING A SKIN RASH.   Time spent in evaluating this patient took more than 1 hour.    ____________________________ Carney Corners. Rudene Re, MD amd:gb D: 04/02/2013 01:23:22 ET T: 04/02/2013 01:46:31 ET JOB#: [id number]  cc: Carney Corners. Rudene Re, MD, <Dictator> Zollie Scale MD ELECTRONICALLY SIGNED 04/02/2013 6:33

## 2015-02-18 NOTE — Discharge Summary (Signed)
PATIENT NAME:  Melanie Novak, [name] MR#:  [id number] DATE OF BIRTH:  Apr 09, 1971  DATE OF ADMISSION:  04/02/2013 DATE OF DISCHARGE:  04/05/2013  ADMITTING DIAGNOSIS: Abdominal pain, as well as rectal bleeding.   DISCHARGE DIAGNOSES: 1.  Abdominal pain, due to acute-on-chronic pancreatitis.  2. Rectal bleeding, possibly due to some duodenitis. EGD showed some duodenal inflammation; negative colonoscopy. Also could be related to internal hemorrhoids.  3.  Acute-on-chronic pancreatitis: The patient used to drink, but she states that she has quit drinking. Due to recurrent nature of her pancreatitis will refer to surgery for possible elective laparoscopic cholecystectomy.  4.  Electrolyte imbalances, including hypomagnesemia and hypokalemia, which are currently being replaced.  5.  Elevated blood pressure noted during hospitalization: The patient was started on atenolol.   EVALUATIONS: EGD showed some duodenal inflammation. Colonoscopy showed internal hemorrhoids, without any active bleeding.   CONSULTANTS: Dr. Servando SnareWohl.  PERTINENT LABS AND EVALUATIONS: Urine culture showed no growth.   Pregnancy was negative.   UA showed leukocytes negative. WBCs 11, lipase 647.   BMP: Glucose 119, BUN 32, creatinine 1.0, sodium 132, potassium 3.2, chloride 97, CO2 was 25, calcium 9.9.   LFTs: Total protein was 9.5.   WBC count was 17.2, hemoglobin 16.6. Blood cultures x 2: No growth.   CT scan of the abdomen and pelvis showed no acute abdominal or pelvic abnormality, stable-appearing cystic area in the spleen.   Stool culture showed heavy growth, Candida dubliniensis.   Most recent lipase today is 262.   HOSPITAL COURSE: Please refer to H and P done by the admitting physician. The patient is a 44 year old white female who presented with abdominal pain. Also was complaining of rectal bleeding, who underwent an EGD and a colonoscopy. Her issues:  1.  Acute-on-chronic pancreatitis: The patient has had previous  similar symptoms, however they were related to alcohol use in the past, but she reported that she has not drank anything. She was kept n.p.o., on pain control. Her fasting lipid panel was negative for elevated triglycerides. She is not on any medications to explain her pancreatitis. She underwent a right upper quadrant ultrasound which was negative for any gallstones or sludge. The patient's pain is mostly resolved now, and her diet was advanced.  2.  Rectal bleeding: The patient was seen by GI who did an EGD and a colonoscopy, which just showed some duodenal inflammation and internal hemorrhoids, but no active bleeding was noted. Her hemoglobin is stable.  3.  Electrolytes, which were repleted: The patient will be placed on some electrolyte replacements as an outpatient. At this time, she is doing much better and is stable for discharge.   DISCHARGE MEDICATIONS: Prilosec 20 mg tablets p.o. b.i.d., atenolol 50 daily, acetaminophen/oxycodone 325/5, 1 tablet p.o. q.8 p.r.n. for pain, magnesium oxide 400, 1 tablet  p.o. t.i.d., potassium 20 mEq 1 tablet .o. daily x 5 days, magnesium oxide x 5 days.   DIET: Low-sodium, low-fat, low-cholesterol diet.   ACTIVITY: As tolerated.   FOLLOWUP: At [facility]. Follow with Dr. Michela PitcherEly for possible lap cholecystectomy for recurrent pancreatitis.   NOTE: Thirty-two minutes spent on the discharge.     ____________________________ Lacie ScottsShreyang H. Allena KatzPatel, MD shp:dm D: 04/05/2013 12:39:25 ET T: 04/05/2013 15:12:13 ET JOB#: [id number]  cc: Kharlie Bring H. Allena KatzPatel, MD, <Dictator> Charise CarwinSHREYANG H Oswin Griffith MD ELECTRONICALLY SIGNED 04/11/2013 15:22

## 2016-07-29 DEATH — deceased
